# Patient Record
Sex: Female | Born: 1995 | Race: White | Hispanic: No | Marital: Single | Smoking: Current every day smoker
Health system: Southern US, Community
[De-identification: ages and names within clinical notes are randomized; demographics above are authoritative.]

---
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
## 2016-04-01 ENCOUNTER — Emergency Department
Admission: EM | Admit: 2016-04-01 | Discharge: 2016-04-01 | Disposition: A | Discharge status: Home / Self Care | Payer: Self-pay | Attending: Emergency Medicine | Admitting: Emergency Medicine

## 2016-04-01 ENCOUNTER — Encounter: Payer: Self-pay | Admitting: Emergency Medicine

## 2016-04-01 DIAGNOSIS — R112 Nausea with vomiting, unspecified: Secondary | ICD-10-CM | POA: Insufficient documentation

## 2016-04-01 DIAGNOSIS — F17200 Nicotine dependence, unspecified, uncomplicated: Secondary | ICD-10-CM | POA: Insufficient documentation

## 2016-04-01 DIAGNOSIS — R55 Syncope and collapse: Secondary | ICD-10-CM | POA: Insufficient documentation

## 2016-04-01 LAB — URINALYSIS COMPLETE WITH MICROSCOPIC (ARMC ONLY)
BILIRUBIN URINE: NEGATIVE
GLUCOSE, UA: NEGATIVE mg/dL
Hgb urine dipstick: NEGATIVE
Ketones, ur: NEGATIVE mg/dL
Leukocytes, UA: NEGATIVE
Nitrite: NEGATIVE
Protein, ur: NEGATIVE mg/dL
Specific Gravity, Urine: 1.015 (ref 1.005–1.030)
pH: 5 (ref 5.0–8.0)

## 2016-04-01 LAB — CBC
HEMATOCRIT: 44 % (ref 35.0–47.0)
HEMOGLOBIN: 14.6 g/dL (ref 12.0–16.0)
MCH: 29 pg (ref 26.0–34.0)
MCHC: 33.2 g/dL (ref 32.0–36.0)
MCV: 87.4 fL (ref 80.0–100.0)
Platelets: 271 10*3/uL (ref 150–440)
RBC: 5.03 MIL/uL (ref 3.80–5.20)
RDW: 13.1 % (ref 11.5–14.5)
WBC: 13 10*3/uL — ABNORMAL HIGH (ref 3.6–11.0)

## 2016-04-01 LAB — COMPREHENSIVE METABOLIC PANEL
ALBUMIN: 4.3 g/dL (ref 3.5–5.0)
ALK PHOS: 69 U/L (ref 38–126)
ALT: 21 U/L (ref 14–54)
ANION GAP: 10 (ref 5–15)
AST: 21 U/L (ref 15–41)
BILIRUBIN TOTAL: 0.3 mg/dL (ref 0.3–1.2)
BUN: 11 mg/dL (ref 6–20)
CALCIUM: 9.2 mg/dL (ref 8.9–10.3)
CO2: 22 mmol/L (ref 22–32)
Chloride: 107 mmol/L (ref 101–111)
Creatinine, Ser: 0.73 mg/dL (ref 0.44–1.00)
GFR calc non Af Amer: >60 mL/min (ref >60)
Glucose, Bld: 89 mg/dL (ref 65–99)
POTASSIUM: 4 mmol/L (ref 3.5–5.1)
SODIUM: 139 mmol/L (ref 135–145)
TOTAL PROTEIN: 7.4 g/dL (ref 6.5–8.1)

## 2016-04-01 LAB — LIPASE, BLOOD: LIPASE: 17 U/L (ref 11–51)

## 2016-04-01 LAB — PREGNANCY, URINE: PREG TEST UR: NEGATIVE

## 2016-04-01 MED ORDER — SODIUM CHLORIDE 0.9 % IV BOLUS (SEPSIS)
1000.0000 mL | Freq: Once | INTRAVENOUS | Status: AC
  Administered 2016-04-01: 1000 mL via INTRAVENOUS

## 2016-04-01 MED ORDER — ONDANSETRON 4 MG PO TBDP: 4.0000 mg | ORAL_TABLET | Freq: Three times a day (TID) | ORAL | Status: AC | PRN

## 2016-04-01 MED ORDER — ONDANSETRON HCL 4 MG/2ML IJ SOLN
4.0000 mg | Freq: Once | INTRAMUSCULAR | Status: AC
  Administered 2016-04-01: 4 mg via INTRAVENOUS
  Filled 2016-04-01: qty 2

## 2016-04-01 NOTE — ED Notes (Signed)
Pt to ed with c/o vomiting x 4 days.  Pt denies diarrhea.  States abd pain associated with vomiting.  States vomited x 5 in last 24 hours.

## 2016-04-01 NOTE — Discharge Instructions (Signed)
Nausea and Vomiting °Nausea is a sick feeling that often comes before throwing up (vomiting). Vomiting is a reflex where stomach contents come out of your mouth. Vomiting can cause severe loss of body fluids (dehydration). Children and elderly adults can become dehydrated quickly, especially if they also have diarrhea. Nausea and vomiting are symptoms of a condition or disease. It is important to find the cause of your symptoms. °CAUSES  °· Direct irritation of the stomach lining. This irritation can result from increased acid production (gastroesophageal reflux disease), infection, food poisoning, taking certain medicines (such as nonsteroidal anti-inflammatory drugs), alcohol use, or tobacco use. °· Signals from the brain. These signals could be caused by a headache, heat exposure, an inner ear disturbance, increased pressure in the brain from injury, infection, a tumor, or a concussion, pain, emotional stimulus, or metabolic problems. °· An obstruction in the gastrointestinal tract (bowel obstruction). °· Illnesses such as diabetes, hepatitis, gallbladder problems, appendicitis, kidney problems, cancer, sepsis, atypical symptoms of a heart attack, or eating disorders. °· Medical treatments such as chemotherapy and radiation. °· Receiving medicine that makes you sleep (general anesthetic) during surgery. °DIAGNOSIS °Your caregiver may ask for tests to be done if the problems do not improve after a few days. Tests may also be done if symptoms are severe or if the reason for the nausea and vomiting is not clear. Tests may include: °· Urine tests. °· Blood tests. °· Stool tests. °· Cultures (to look for evidence of infection). °· X-rays or other imaging studies. °Test results can help your caregiver make decisions about treatment or the need for additional tests. °TREATMENT °You need to stay well hydrated. Drink frequently but in small amounts. You may wish to drink water, sports drinks, clear broth, or eat frozen  ice pops or gelatin dessert to help stay hydrated. When you eat, eating slowly may help prevent nausea. There are also some antinausea medicines that may help prevent nausea. °HOME CARE INSTRUCTIONS  °· Take all medicine as directed by your caregiver. °· If you do not have an appetite, do not force yourself to eat. However, you must continue to drink fluids. °· If you have an appetite, eat a normal diet unless your caregiver tells you differently. °· Eat a variety of complex carbohydrates (rice, wheat, potatoes, bread), lean meats, yogurt, fruits, and vegetables. °· Avoid high-fat foods because they are more difficult to digest. °· Drink enough water and fluids to keep your urine clear or pale yellow. °· If you are dehydrated, ask your caregiver for specific rehydration instructions. Signs of dehydration may include: °· Severe thirst. °· Dry lips and mouth. °· Dizziness. °· Dark urine. °· Decreasing urine frequency and amount. °· Confusion. °· Rapid breathing or pulse. °SEEK IMMEDIATE MEDICAL CARE IF:  °· You have blood or brown flecks (like coffee grounds) in your vomit. °· You have black or bloody stools. °· You have a severe headache or stiff neck. °· You are confused. °· You have severe abdominal pain. °· You have chest pain or trouble breathing. °· You do not urinate at least once every 8 hours. °· You develop cold or clammy skin. °· You continue to vomit for longer than 24 to 48 hours. °· You have a fever. °MAKE SURE YOU:  °· Understand these instructions. °· Will watch your condition. °· Will get help right away if you are not doing well or get worse. °  °This information is not intended to replace advice given to you by your health care provider. Make sure   you discuss any questions you have with your health care provider.   Document Released: 11/20/2005 Document Revised: 02/12/2012 Document Reviewed: 04/19/2011 Elsevier Interactive Patient Education 2016 [employer].  Syncope Syncope is a medical  term for fainting or passing out. This means you lose consciousness and drop to the ground. People are generally unconscious for less than 5 minutes. You may have some muscle twitches for up to 15 seconds before waking up and returning to normal. Syncope occurs more often in older adults, but it can happen to anyone. While most causes of syncope are not dangerous, syncope can be a sign of a serious medical problem. It is important to seek medical care.  CAUSES  Syncope is caused by a sudden drop in blood flow to the brain. The specific cause is often not determined. Factors that can bring on syncope include:  Taking medicines that lower blood pressure.  Sudden changes in posture, such as standing up quickly.  Taking more medicine than prescribed.  Standing in one place for too long.  Seizure disorders.  Dehydration and excessive exposure to heat.  Low blood sugar (hypoglycemia).  Straining to have a bowel movement.  Heart disease, irregular heartbeat, or other circulatory problems.  Fear, emotional distress, seeing blood, or severe pain. SYMPTOMS  Right before fainting, you may:  Feel dizzy or light-headed.  Feel nauseous.  See all white or all black in your field of vision.  Have cold, clammy skin. DIAGNOSIS  Your health care provider will ask about your symptoms, perform a physical exam, and perform an electrocardiogram (ECG) to record the electrical activity of your heart. Your health care provider may also perform other heart or blood tests to determine the cause of your syncope which may include:  Transthoracic echocardiogram (TTE). During echocardiography, sound waves are used to evaluate how blood flows through your heart.  Transesophageal echocardiogram (TEE).  Cardiac monitoring. This allows your health care provider to monitor your heart rate and rhythm in real time.  Holter monitor. This is a portable device that records your heartbeat and can help diagnose heart  arrhythmias. It allows your health care provider to track your heart activity for several days, if needed.  Stress tests by exercise or by giving medicine that makes the heart beat faster. TREATMENT  In most cases, no treatment is needed. Depending on the cause of your syncope, your health care provider may recommend changing or stopping some of your medicines. HOME CARE INSTRUCTIONS  Have someone stay with you until you feel stable.  Do not drive, use machinery, or play sports until your health care provider says it is okay.  Keep all follow-up appointments as directed by your health care provider.  Lie down right away if you start feeling like you might faint. Breathe deeply and steadily. Wait until all the symptoms have passed.  Drink enough fluids to keep your urine clear or pale yellow.  If you are taking blood pressure or heart medicine, get up slowly and take several minutes to sit and then stand. This can reduce dizziness. SEEK IMMEDIATE MEDICAL CARE IF:   You have a severe headache.  You have unusual pain in the chest, abdomen, or back.  You are bleeding from your mouth or rectum, or you have black or tarry stool.  You have an irregular or very fast heartbeat.  You have pain with breathing.  You have repeated fainting or seizure-like jerking during an episode.  You faint when sitting or lying down.  You  have confusion.  You have trouble walking.  You have severe weakness.  You have vision problems. If you fainted, call your local emergency services (911 in [place] Do not drive yourself to the hospital.    This information is not intended to replace advice given to you by your health care provider. Make sure you discuss any questions you have with your health care provider.   Document Released: 11/20/2005 Document Revised: 04/06/2015 Document Reviewed: 01/19/2012 Elsevier Interactive Patient Education [employer]2016 [employer] Inc[employer]

## 2016-04-01 NOTE — ED Notes (Signed)
Pt states she had diarrhea that started last Sunday and vomiting that started on Wednesday.

## 2016-04-01 NOTE — ED Provider Notes (Signed)
[facility] Emergency Department Provider Note  Time seen: 11:36 AM  I have reviewed the triage vital signs and the nursing notes.   HISTORY  Chief Complaint Emesis    HPI Alen Blewmanda Emig is a 20 y.o. female with no past medical history who presents to the emergency department with vomiting and syncope. According to the patient for the past 4 days she is been nauseated with intermittent vomiting. States she had diarrhea the first day but has not had any since. Occasional abdominal pain but only when vomiting in the epigastrium. States she took a shower today and while showering had a syncopal episode so she came to the emergency department for evaluation. Patient denies any fever. Denies any abdominal pain at rest but states when she vomits she has upper abdominal pain and if you push on her abdomen it hurts in the upper abdomen.States her last period was approximately 2 weeks ago.     History reviewed. No pertinent past medical history.  There are no active problems to display for this patient.   History reviewed. No pertinent past surgical history.  No current outpatient prescriptions on file.  Allergies Review of patient's allergies indicates no known allergies.  History reviewed. No pertinent family history.  Social History Social History  Substance Use Topics  . Smoking status: Current Every Day Smoker  . Smokeless tobacco: None  . Alcohol Use: Yes    Review of Systems Constitutional: Negative for fever. Cardiovascular: Negative for chest pain. Respiratory: Negative for shortness of breath. Gastrointestinal: Mild upper abdominal pain. Positive for nausea, vomiting. Had diarrhea 4 days ago but none since. Genitourinary: Negative for dysuria. Musculoskeletal: Negative for back pain. Neurological: Negative for headache 10-point ROS otherwise negative.  ____________________________________________   PHYSICAL EXAM:  VITAL SIGNS: ED  Triage Vitals  Enc Vitals Group     BP 04/01/16 1124 156/81 mmHg     Pulse Rate 04/01/16 1124 116     Resp 04/01/16 1124 18     Temp 04/01/16 1124 98 F (36.7 C)     Temp Source 04/01/16 1124 Oral     SpO2 04/01/16 1124 97 %     Weight 04/01/16 1118 300 lb (136.079 kg)     Height 04/01/16 1118 5\' 7"  (1.702 m)     Head Cir --      Peak Flow --      Pain Score 04/01/16 1118 1     Pain Loc --      Pain Edu? --      Excl. in GC? --     Constitutional: Alert and oriented. Well appearing and in no distress. Eyes: Normal exam ENT   Head: Normocephalic and atraumatic   Mouth/Throat: Mucous membranes are moist. Cardiovascular: Normal rate, regular rhythmAround 100 bpm. No murmur Respiratory: Normal respiratory effort without tachypnea nor retractions. Breath sounds are clear and equal bilaterally. No wheezes/rales/rhonchi. Gastrointestinal: Soft, mild epigastric tenderness to palpation. No rebound or guarding. No distention. Musculoskeletal: Nontender with normal range of motion in all extremities.  Neurologic:  Normal speech and language. No gross focal neurologic deficits  Skin:  Skin is warm, dry and intact.  Psychiatric: Mood and affect are normal.   ____________________________________________    EKG  EKG reviewed and interpreted by myself shows normal sinus rhythm at 90 bpm, narrow QRS, normal axis, normal intervals, no ST changes. Overall normal EKG.  ____________________________________________    INITIAL IMPRESSION / ASSESSMENT AND PLAN / ED COURSE  Pertinent labs & imaging  results that were available during my care of the patient were reviewed by me and considered in my medical decision making (see chart for details).  Patient presents the emergency department upper abdominal discomfort, nausea, vomiting for the past 4 days. Patient states she has been feeling dehydrated, while taking a shower today she had a brief syncopal episode. Denies any chest pain. We  will check labs, treat her nausea, IV hydrate, obtain an EKG and closely monitor in the emergency department.  Patient's labs are within normal limits. EKG within normal limits. Patient is feeling much better after Zofran, asking for something to drink. We will discharge the patient home with oral Zofran as needed and primary care follow-up. Patient is agreeable to this plan.  ____________________________________________   FINAL CLINICAL IMPRESSION(S) / ED DIAGNOSES  Nausea vomiting Syncope  Minna Antis, MD 04/01/16 1413
